# Patient Record
Sex: Male | Born: 1960 | Race: Black or African American | Hispanic: No | Marital: Married | Smoking: Never smoker
Health system: Southern US, Community
[De-identification: ages and names within clinical notes are randomized; demographics above are authoritative.]

## PROBLEM LIST (undated history)

## (undated) DIAGNOSIS — R011 Cardiac murmur, unspecified: Secondary | ICD-10-CM

## (undated) DIAGNOSIS — I10 Essential (primary) hypertension: Secondary | ICD-10-CM

## (undated) HISTORY — DX: Essential (primary) hypertension: I10

## (undated) HISTORY — DX: Cardiac murmur, unspecified: R01.1

---

## 2001-06-01 ENCOUNTER — Encounter: Payer: Self-pay | Admitting: Emergency Medicine

## 2001-06-01 ENCOUNTER — Emergency Department (HOSPITAL_COMMUNITY): Admission: EM | Admit: 2001-06-01 | Discharge: 2001-06-01 | Payer: Self-pay | Admitting: Emergency Medicine

## 2002-05-06 ENCOUNTER — Emergency Department (HOSPITAL_COMMUNITY): Admission: EM | Admit: 2002-05-06 | Discharge: 2002-05-06 | Payer: Self-pay | Admitting: Emergency Medicine

## 2003-10-15 ENCOUNTER — Emergency Department (HOSPITAL_COMMUNITY): Admission: EM | Admit: 2003-10-15 | Discharge: 2003-10-16 | Payer: Self-pay | Admitting: Emergency Medicine

## 2004-06-14 ENCOUNTER — Emergency Department (HOSPITAL_COMMUNITY): Admission: EM | Admit: 2004-06-14 | Discharge: 2004-06-14 | Payer: Self-pay | Admitting: Emergency Medicine

## 2004-09-06 ENCOUNTER — Ambulatory Visit: Payer: Self-pay | Admitting: Cardiology

## 2004-09-28 ENCOUNTER — Ambulatory Visit: Payer: Self-pay | Admitting: Internal Medicine

## 2005-07-28 ENCOUNTER — Emergency Department (HOSPITAL_COMMUNITY): Admission: EM | Admit: 2005-07-28 | Discharge: 2005-07-28 | Payer: Self-pay | Admitting: Emergency Medicine

## 2006-06-10 ENCOUNTER — Emergency Department (HOSPITAL_COMMUNITY): Admission: EM | Admit: 2006-06-10 | Discharge: 2006-06-10 | Payer: Self-pay | Admitting: Emergency Medicine

## 2006-08-03 ENCOUNTER — Emergency Department (HOSPITAL_COMMUNITY): Admission: EM | Admit: 2006-08-03 | Discharge: 2006-08-03 | Payer: Self-pay | Admitting: Emergency Medicine

## 2014-04-15 ENCOUNTER — Ambulatory Visit (INDEPENDENT_AMBULATORY_CARE_PROVIDER_SITE_OTHER): Payer: 59 | Admitting: Family Medicine

## 2014-04-15 ENCOUNTER — Ambulatory Visit (INDEPENDENT_AMBULATORY_CARE_PROVIDER_SITE_OTHER): Payer: 59

## 2014-04-15 VITALS — BP 140/84 | HR 94 | Temp 97.8°F | Resp 18 | Ht 72.0 in | Wt 248.6 lb

## 2014-04-15 DIAGNOSIS — R2240 Localized swelling, mass and lump, unspecified lower limb: Secondary | ICD-10-CM

## 2014-04-15 NOTE — Progress Notes (Signed)
[facility] [address], [place] [id number] [phone]  Date:  04/15/2014   Name:  Melvin Valdez   DOB:  02/12/1961   MRN:  [id number]  PCP:  No primary care provider on file.    Chief Complaint: knot leg   History of Present Illness:  Melvin Valdez is a 53 y.o. very pleasant male patient who presents with the following:  He has a knot on his left lower leg which has been present for about one month.  [place].  He has not had this area evaluated in the past He thought he might have bumped his leg, but is not sure if this caused the knot.  The knot will ache and throb at times, but not consistently.  He otherwise feels well and has no other complaints today  There are no active problems to display for this patient.   Past Medical History  Diagnosis Date  . Heart murmur   . Hypertension     History reviewed. No pertinent past surgical history.  History  Substance Use Topics  . Smoking status: Never Smoker   . Smokeless tobacco: Not on file  . Alcohol Use: Not on file    Family History  Problem Relation Age of Onset  . Hypertension Mother   . Hyperlipidemia Father   . Heart disease Father     No Known Allergies  Medication list has been reviewed and updated.  No current outpatient prescriptions on file prior to visit.   No current facility-administered medications on file prior to visit.    Review of Systems:  As per HPI- otherwise negative.   Physical Examination: Filed Vitals:   04/15/14 0920  BP: 140/84  Pulse: 94  Temp: 97.8 F (36.6 C)  Resp: 18   Filed Vitals:   04/15/14 0920  Height: 6' (1.829 m)  Weight: 248 lb 9.6 oz (112.764 kg)   Body mass index is 33.71 kg/(m^2). Ideal Body Weight: Weight in (lb) to have BMI = 25: 183.9  GEN: WDWN, NAD, Non-toxic, A & O x 3, obese, looks well HEENT: Atraumatic, Normocephalic. Neck supple. No masses, No LAD. Ears and Nose: No external  deformity. CV: RRR, No M/G/R. No JVD. No thrill. No extra heart sounds. PULM: CTA B, no wheezes, crackles, rhonchi. No retractions. No resp. distress. No accessory muscle use. EXTR: No c/c/e NEURO Normal gait.  PSYCH: Normally interactive. Conversant. Not depressed or anxious appearing.  Calm demeanor.  Left lower leg: there is a slight prominence on the medial tibia about 1/3 way up the leg.  It is not sore to touch, no heat or redness.  It is firm but not as hard as bone  [facility] reading (PRIMARY) by  Dr. Patsy [place]ager[place] Tibia/ fibula:negative for any mass or fracture.  ?old ankle fracture  LEFT TIBIA AND FIBULA - 2 VIEW  COMPARISON: None.  FINDINGS: There is no evidence of fracture or other focal bone lesions. Soft tissues are unremarkable.  IMPRESSION: No foreign body. No soft tissue abnormality. No osseous abnormality.  Assessment and Plan: Localized swelling of lower leg - Plan: DG Tibia/Fibula Left, Lower Extremity Venous Duplex Left, US Extrem Low Left Ltd  Nodule on left tibia, no evidence of bony abnormality. Suspect this is likely induration from some trauma to the area.  Will US the area to get further information, he is also concerned about a clot so will doppler as well  Signed Whisper Kurka,  MD   

## 2014-04-16 ENCOUNTER — Other Ambulatory Visit (HOSPITAL_COMMUNITY): Payer: Self-pay | Admitting: *Deleted

## 2014-04-16 DIAGNOSIS — M7989 Other specified soft tissue disorders: Secondary | ICD-10-CM

## 2014-04-23 ENCOUNTER — Ambulatory Visit (HOSPITAL_COMMUNITY): Payer: 59 | Attending: Cardiology | Admitting: *Deleted

## 2014-04-23 DIAGNOSIS — R2240 Localized swelling, mass and lump, unspecified lower limb: Secondary | ICD-10-CM

## 2014-04-23 DIAGNOSIS — M7989 Other specified soft tissue disorders: Secondary | ICD-10-CM

## 2014-04-23 NOTE — Progress Notes (Signed)
Venous Duplex Lower Left Performed 

## 2014-04-26 ENCOUNTER — Telehealth: Payer: Self-pay | Admitting: Family Medicine

## 2014-04-26 NOTE — Telephone Encounter (Signed)
received his US report:  No evidence of left lower extremity deep or superficial venous thrombus or incompetence. Small anechoic, avascular mass in the left shin in the area of the knot.  Called and discussed with his wife.  No evidence of DVT.  Suspect he did bump his leg and caused the mass that is seen.  Recheck in one month to see if persistent

## 2014-04-28 ENCOUNTER — Encounter: Payer: Self-pay | Admitting: Family Medicine

## 2014-08-29 ENCOUNTER — Emergency Department (HOSPITAL_COMMUNITY)
Admission: EM | Admit: 2014-08-29 | Discharge: 2014-08-29 | Disposition: A | Discharge status: Home / Self Care | Payer: 59 | Attending: Emergency Medicine | Admitting: Emergency Medicine

## 2014-08-29 ENCOUNTER — Encounter (HOSPITAL_COMMUNITY): Payer: Self-pay

## 2014-08-29 DIAGNOSIS — K08409 Partial loss of teeth, unspecified cause, unspecified class: Secondary | ICD-10-CM

## 2014-08-29 DIAGNOSIS — I10 Essential (primary) hypertension: Secondary | ICD-10-CM | POA: Insufficient documentation

## 2014-08-29 DIAGNOSIS — Z79899 Other long term (current) drug therapy: Secondary | ICD-10-CM | POA: Insufficient documentation

## 2014-08-29 DIAGNOSIS — K91840 Postprocedural hemorrhage and hematoma of a digestive system organ or structure following a digestive system procedure: Secondary | ICD-10-CM | POA: Insufficient documentation

## 2014-08-29 DIAGNOSIS — R58 Hemorrhage, not elsewhere classified: Secondary | ICD-10-CM

## 2014-08-29 DIAGNOSIS — R011 Cardiac murmur, unspecified: Secondary | ICD-10-CM | POA: Diagnosis not present

## 2014-08-29 DIAGNOSIS — Z7982 Long term (current) use of aspirin: Secondary | ICD-10-CM | POA: Diagnosis not present

## 2014-08-29 MED ORDER — "THROMBI-PAD 3""X3"" EX PADS"
1.0000 | MEDICATED_PAD | Freq: Once | CUTANEOUS | Status: AC
  Administered 2014-08-29: 1 via TOPICAL
  Filled 2014-08-29: qty 1

## 2014-08-29 MED ORDER — SILVER NITRATE-POT NITRATE 75-25 % EX MISC
1.0000 | Freq: Once | CUTANEOUS | Status: AC
  Administered 2014-08-29: 1 via TOPICAL
  Filled 2014-08-29: qty 1

## 2014-08-29 NOTE — ED Provider Notes (Signed)
CSN: [id number]     Arrival date & time 08/29/14  0345 History   First MD Initiated Contact with Patient 08/29/14 0449     Chief Complaint  Patient presents with  . Post-op Problem     (Consider location/radiation/quality/duration/timing/severity/associated sxs/prior Treatment) HPI  Patient reports he had 2 teeth pulled on April 9 about 1:00 in the afternoon. He had sutures placed. He reports a lot of bleeding afterwards. He was receiving by his dentist at 10:30 last night who put more sutures in the area. He was told if the bleeding had not totally stopped he should come to the emergency room. He reports he has been putting gauze over the area to put pressure on it however he still has some blood. He shows me the gauze and there is some blood on the gauze but it is not heavily soaked. Patient reports he had not stopped taking his aspirin or ginseng prior to having his teeth extracted.  PCP PA Anna Genre  Past Medical History  Diagnosis Date  . Heart murmur   . Hypertension    History reviewed. No pertinent past surgical history. Family History  Problem Relation Age of Onset  . Hypertension Mother   . Hyperlipidemia Father   . Heart disease Father    History  Substance Use Topics  . Smoking status: Never Smoker   . Smokeless tobacco: Not on file  . Alcohol Use: No  lives with spouse  Review of Systems  All other systems reviewed and are negative.     Allergies  Review of patient's allergies indicates no known allergies.  Home Medications   Prior to Admission medications   Medication Sig Start Date End Date Taking? Authorizing Provider  aspirin 81 MG tablet Take 81 mg by mouth daily.   Yes Historical Provider, MD  GINSENG PO Take 1 tablet by mouth daily.   Yes Historical Provider, MD  ibuprofen (ADVIL,MOTRIN) 200 MG tablet Take 200-400 mg by mouth every 6 (six) hours as needed for moderate pain.    Yes Historical Provider, MD  Multiple Vitamin (MULTIVITAMIN WITH MINERALS)  TABS tablet Take 1 tablet by mouth daily.   Yes Historical Provider, MD   BP 127/90 mmHg  Pulse 82  Temp(Src) 97.8 F (36.6 C) (Oral)  Resp 18  Ht 6' (1.829 m)  Wt 240 lb (108.863 kg)  BMI 32.54 kg/m2  SpO2 100%  Vital signs normal   Physical Exam  Constitutional: He is oriented to person, place, and time. He appears well-developed and well-nourished.  Non-toxic appearance. He does not appear ill. No distress.  HENT:  Head: Normocephalic and atraumatic.  Right Ear: External ear normal.  Left Ear: External ear normal.  Nose: Nose normal. No mucosal edema or rhinorrhea.  Mouth/Throat: Oropharynx is clear and moist and mucous membranes are normal. No dental abscesses or uvula swelling.    Patient is noted to have sutures in his left lower mandible where he has had 2 teeth extracted. There was 1 small area that seemed to have a small slow oozing that was outside of the suture area.  Eyes: Conjunctivae and EOM are normal. Pupils are equal, round, and reactive to light.  Neck: Normal range of motion and full passive range of motion without pain. Neck supple.  Pulmonary/Chest: Effort normal. No respiratory distress. He has no rhonchi. He exhibits no crepitus.  Abdominal: Normal appearance.  Musculoskeletal: Normal range of motion.  Moves all extremities well.   Neurological: He is alert and oriented to  person, place, and time. He has normal strength. No cranial nerve deficit.  Skin: Skin is warm, dry and intact. No rash noted. No erythema. No pallor.  Psychiatric: He has a normal mood and affect. His speech is normal and behavior is normal. His mood appears not anxious.  Nursing note and vitals reviewed.   ED Course  Procedures (including critical care time)  Medications  silver nitrate applicators applicator 1 Stick (1 Stick Topical Given 08/29/14 0553)  THROMBI-PAD (THROMBI-PAD) 3"X3" pad 1 each (1 each Topical Given 08/29/14 0553)    06:20 I used a silver nitrate stick to  cauterize the area I thought was bleeding. The thrombin pad was placed over the area and a folded 2 x 2 placed over it to apply pressure to the tooth sockets.  Recheck at 7:15 AM there is no blood seeping through the thrombin pad or gauze. Patient feels ready to be discharged.  Labs Review Labs Reviewed - No data to display  Imaging Review No results found.   EKG Interpretation None      MDM   Final diagnoses:  S/P tooth extraction, unspecified edentulism  Bleeding     Plan discharge  Devoria AlbeIva Loriann Bosserman, MD, Concha PyoFACEP    Hiba Garry, MD 08/29/14 [phone]

## 2014-08-29 NOTE — ED Notes (Signed)
Pt reported constant small amt of bright red bleeding dental sx site s/p to having tooth removal and stitches in place. Family at bedside.

## 2014-08-29 NOTE — Discharge Instructions (Signed)
Stop the aspirin and ginseng. Use the thrombin pads on the bleeding sites with pressure today. Let your dentist know if you continue to have bleeding.

## 2014-08-29 NOTE — ED Notes (Signed)
Patient had two teeth pulled at the dentist office yesterday.  He has been bleeding from that area since then.  He reports that dentist met him at 2230 last night and placed two additional stitches in his gums.  Bleeding continues.  Patient takes a baby aspirin daily.

## 2015-07-29 ENCOUNTER — Encounter (HOSPITAL_COMMUNITY): Payer: Self-pay | Admitting: Emergency Medicine

## 2015-07-29 ENCOUNTER — Emergency Department (HOSPITAL_COMMUNITY)
Admission: EM | Admit: 2015-07-29 | Discharge: 2015-07-29 | Disposition: A | Discharge status: Home / Self Care | Payer: 59 | Attending: Emergency Medicine | Admitting: Emergency Medicine

## 2015-07-29 DIAGNOSIS — R319 Hematuria, unspecified: Secondary | ICD-10-CM | POA: Diagnosis present

## 2015-07-29 DIAGNOSIS — I10 Essential (primary) hypertension: Secondary | ICD-10-CM | POA: Diagnosis not present

## 2015-07-29 DIAGNOSIS — Z7982 Long term (current) use of aspirin: Secondary | ICD-10-CM | POA: Diagnosis not present

## 2015-07-29 DIAGNOSIS — Z79899 Other long term (current) drug therapy: Secondary | ICD-10-CM | POA: Insufficient documentation

## 2015-07-29 DIAGNOSIS — R011 Cardiac murmur, unspecified: Secondary | ICD-10-CM | POA: Diagnosis not present

## 2015-07-29 LAB — URINALYSIS, ROUTINE W REFLEX MICROSCOPIC
Bilirubin Urine: NEGATIVE
Glucose, UA: NEGATIVE mg/dL
Ketones, ur: NEGATIVE mg/dL
Leukocytes, UA: NEGATIVE
Nitrite: NEGATIVE
Protein, ur: NEGATIVE mg/dL
Specific Gravity, Urine: 1.011 (ref 1.005–1.030)
pH: 6.5 (ref 5.0–8.0)

## 2015-07-29 LAB — URINE MICROSCOPIC-ADD ON
Bacteria, UA: NONE SEEN
Squamous Epithelial / LPF: NONE SEEN

## 2015-07-29 NOTE — Discharge Instructions (Signed)
Your urinalysis today showed blood, but otherwise no signs of infection.  Kidney stones can cause hematuria. You are not having other symptoms which may suggest this though. You need to have repeat urinalysis. If you have persistent hematuria then you need to see a urologist for further evaluation.  Hematuria, Adult Hematuria is blood in your urine. It can be caused by a bladder infection, kidney infection, prostate infection, kidney stone, or cancer of your urinary tract. Infections can usually be treated with medicine, and a kidney stone usually will pass through your urine. If neither of these is the cause of your hematuria, further workup to find out the reason may be needed. It is very important that you tell your health care provider about any blood you see in your urine, even if the blood stops without treatment or happens without causing pain. Blood in your urine that happens and then stops and then happens again can be a symptom of a very serious condition. Also, pain is not a symptom in the initial stages of many urinary cancers. HOME CARE INSTRUCTIONS   Drink lots of fluid, 3-4 quarts a day. If you have been diagnosed with an infection, cranberry juice is especially recommended, in addition to large amounts of water.  Avoid caffeine, tea, and carbonated beverages because they tend to irritate the bladder.  Avoid alcohol because it may irritate the prostate.  Take all medicines as directed by your health care provider.  If you were prescribed an antibiotic medicine, finish it all even if you start to feel better.  If you have been diagnosed with a kidney stone, follow your health care provider's instructions regarding straining your urine to catch the stone.  Empty your bladder often. Avoid holding urine for long periods of time.  After a bowel movement, women should cleanse front to back. Use each tissue only once.  Empty your bladder before and after sexual intercourse if you are a  male. SEEK MEDICAL CARE IF:  You develop back pain.  You have a fever.  You have a feeling of sickness in your stomach (nausea) or vomiting.  Your symptoms are not better in 3 days. Return sooner if you are getting worse. SEEK IMMEDIATE MEDICAL CARE IF:   You develop severe vomiting and are unable to keep the medicine down.  You develop severe back or abdominal pain despite taking your medicines.  You begin passing a large amount of blood or clots in your urine.  You feel extremely weak or faint, or you pass out. MAKE SURE YOU:   Understand these instructions.  Will watch your condition.  Will get help right away if you are not doing well or get worse.   This information is not intended to replace advice given to you by your health care provider. Make sure you discuss any questions you have with your health care provider.   Document Released: 05/07/2005 Document Revised: 05/28/2014 Document Reviewed: 01/05/2013 Elsevier Interactive Patient Education [employer]

## 2015-07-29 NOTE — ED Notes (Signed)
Pt had an episode of hematuria this am. Did have some burning when he urinated, but otherwise did not have any pain. Denies any recent complaints. No hx of kidney stones or kidney problems.

## 2015-07-29 NOTE — ED Provider Notes (Signed)
CSN: [id number]     Arrival date & time 07/29/15  0712 History   First MD Initiated Contact with Patient 07/29/15 0732     Chief Complaint  Patient presents with  . Hematuria     (Consider location/radiation/quality/duration/timing/severity/associated sxs/prior Treatment) HPI   55 year old male with hematuria. Painless. First noticed when he woke up this morning. Previous void last night seemed normal. He denies any pain. No penile discharge, concerning skin lesions or swelling. 81mg  ASA daily otherwise not blood thinners. No blood in his stool or melena. No dizziness, lightheadedness or shortness of breath. No fever or chills. Denies any significant urologic history.   Past Medical History  Diagnosis Date  . Heart murmur   . Hypertension    History reviewed. No pertinent past surgical history. Family History  Problem Relation Age of Onset  . Hypertension Mother   . Hyperlipidemia Father   . Heart disease Father    Social History  Substance Use Topics  . Smoking status: Never Smoker   . Smokeless tobacco: None  . Alcohol Use: No    Review of Systems  All systems reviewed and negative, other than as noted in HPI.   Allergies  Review of patient's allergies indicates no known allergies.  Home Medications   Prior to Admission medications   Medication Sig Start Date End Date Taking? Authorizing Provider  aspirin 81 MG tablet Take 81 mg by mouth daily.    Historical Provider, MD  GINSENG PO Take 1 tablet by mouth daily.    Historical Provider, MD  ibuprofen (ADVIL,MOTRIN) 200 MG tablet Take 200-400 mg by mouth every 6 (six) hours as needed for moderate pain.     Historical Provider, MD  Multiple Vitamin (MULTIVITAMIN WITH MINERALS) TABS tablet Take 1 tablet by mouth daily.    Historical Provider, MD   BP 145/97 mmHg  Pulse 83  Temp(Src) 98.3 F (36.8 C) (Oral)  Resp 16  SpO2 100% Physical Exam  Constitutional: He appears well-developed and well-nourished. No  distress.  HENT:  Head: Normocephalic and atraumatic.  Eyes: Conjunctivae are normal. Right eye exhibits no discharge. Left eye exhibits no discharge.  Neck: Neck supple.  Cardiovascular: Normal rate, regular rhythm and normal heart sounds.  Exam reveals no gallop and no friction rub.   No murmur heard. Pulmonary/Chest: Effort normal and breath sounds normal. No respiratory distress.  Abdominal: Soft. He exhibits no distension. There is no tenderness.  Genitourinary: Penis normal.  No cva tenderness  Musculoskeletal: He exhibits no edema or tenderness.  Neurological: He is alert.  Skin: Skin is warm and dry.  Psychiatric: He has a normal mood and affect. His behavior is normal. Thought content normal.  Nursing note and vitals reviewed.   ED Course  Procedures (including critical care time) Labs Review Labs Reviewed  URINALYSIS, ROUTINE W REFLEX MICROSCOPIC (NOT AT [facility]) - Abnormal; Notable for the following:    APPearance CLOUDY (*)    Hgb urine dipstick TRACE (*)    All other components within normal limits  URINE MICROSCOPIC-ADD ON    Imaging Review No results found. I have personally reviewed and evaluated these images and lab results as part of my medical decision-making.   EKG Interpretation None      MDM   Final diagnoses:  Painless hematuria    55 year old male with painless hematuria. Nonfocal exam. Urinalysis with trace blood. Not consistent with urinary tract infection. He is declining STD testing. He reports that he is in a monogamous relationship.  Denies penile discharge. No pyuria. At this point I feel he can be safely discharged. At the very least he needs repeat UA to evaluate for hematuria. Urology evaluation if persists. Return precautions discussed.    Raeford Razor, MD 07/29/15 [phone]

## 2016-02-10 IMAGING — CR DG TIBIA/FIBULA 2V*L*
2 series · 2 of 2 positions shown · non-contrast
Comparison: None.

CLINICAL DATA: bump" located medially on mid lower leg surface.
Symptoms for 1 week. No injury

EXAM:
LEFT TIBIA AND FIBULA - 2 VIEW

[AP]
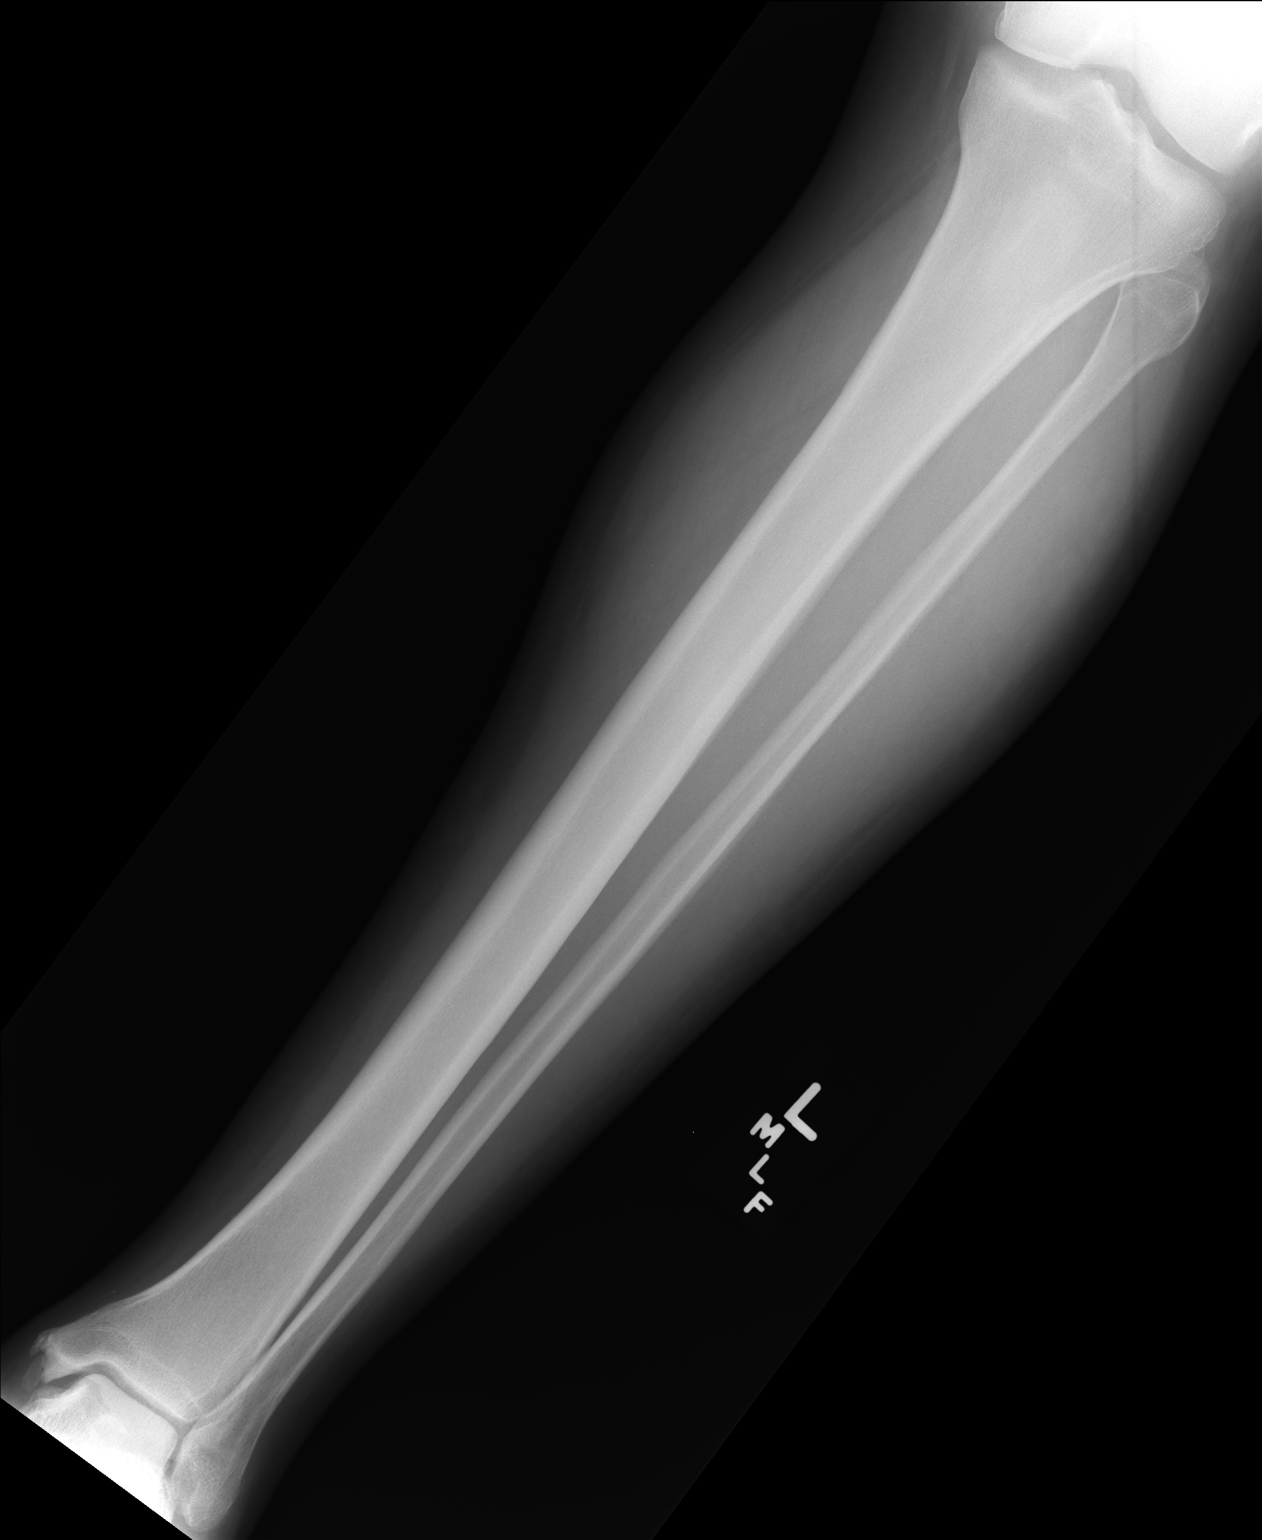

[lateral]
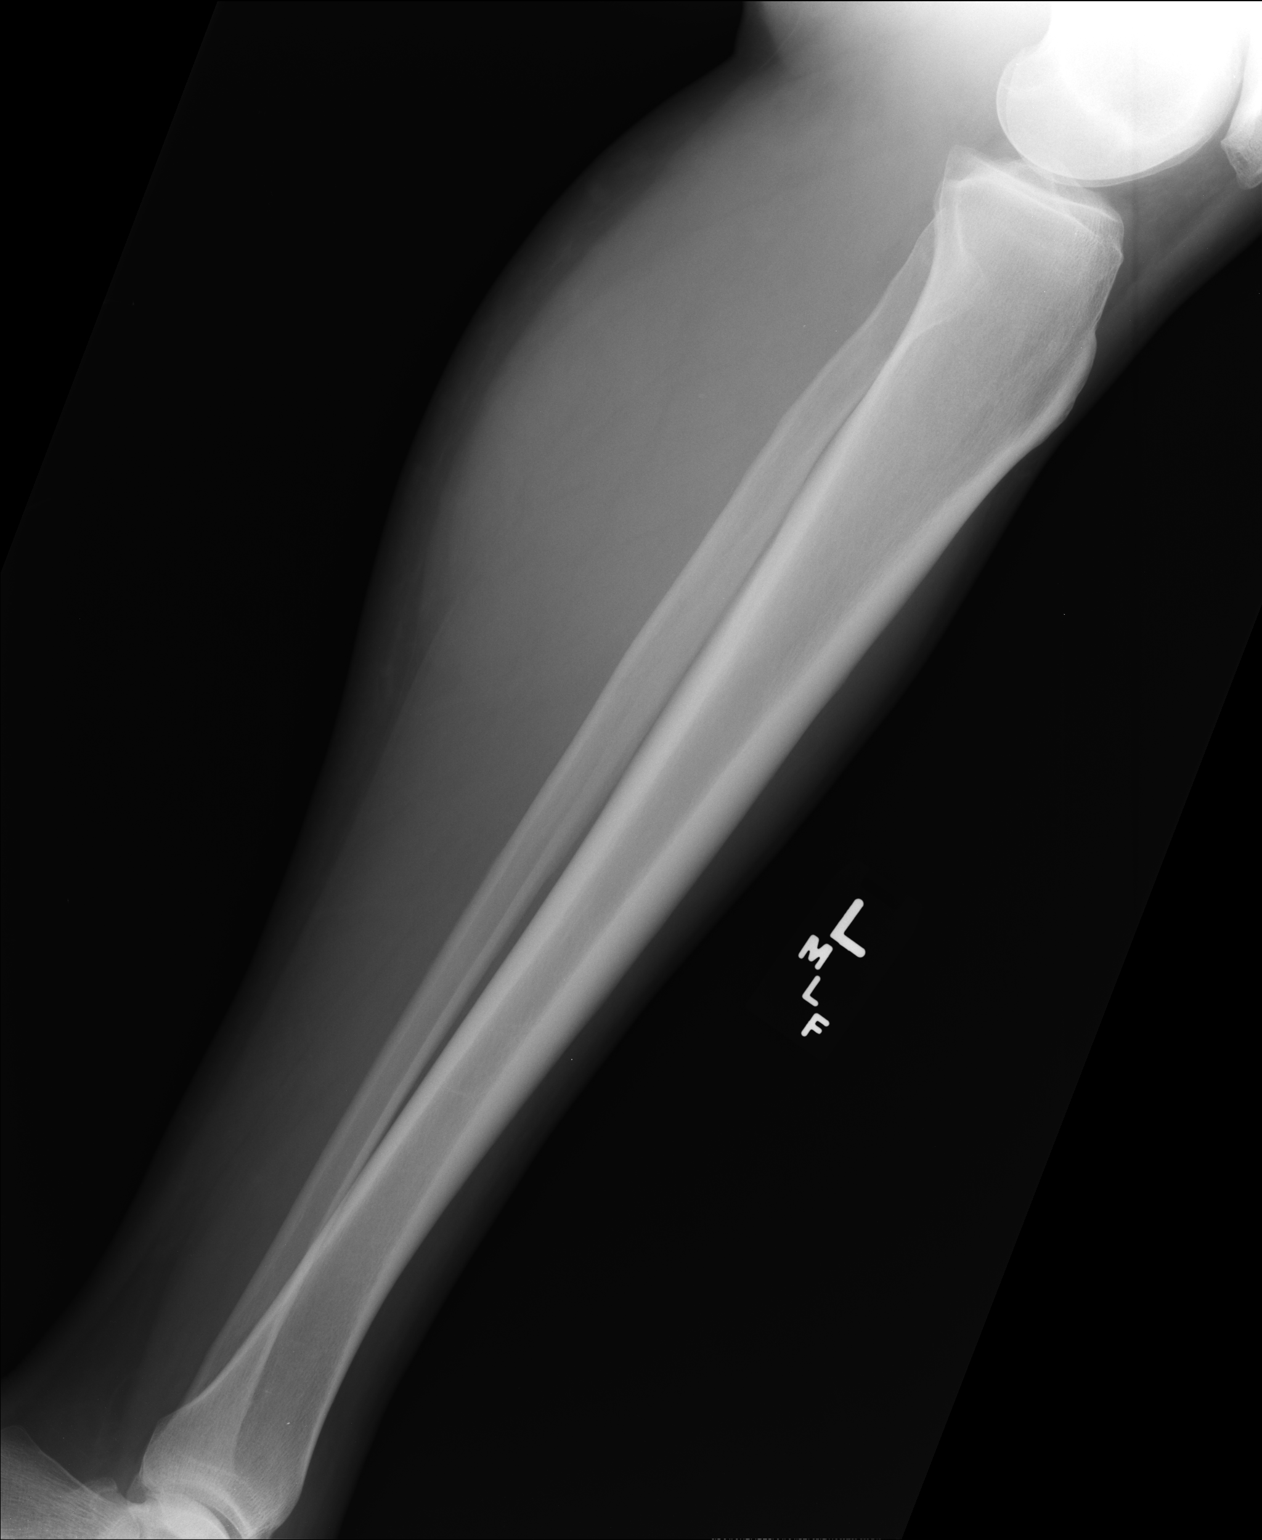

[2 of 2 positions shown; findings below may reference images not displayed]

FINDINGS: There is no evidence of fracture or other focal bone lesions. Soft
tissues are unremarkable.
IMPRESSION: No foreign body. No soft tissue abnormality. No osseous abnormality.
